# Patient Record
Sex: Female | Born: 1935 | Race: White | Hispanic: No | Marital: Single | Smoking: Never smoker
Health system: Southern US, Community
[De-identification: ages and names within clinical notes are randomized; demographics above are authoritative.]

## PROBLEM LIST (undated history)

## (undated) DIAGNOSIS — E039 Hypothyroidism, unspecified: Secondary | ICD-10-CM

## (undated) DIAGNOSIS — F0390 Unspecified dementia without behavioral disturbance: Secondary | ICD-10-CM

## (undated) DIAGNOSIS — I639 Cerebral infarction, unspecified: Secondary | ICD-10-CM

## (undated) DIAGNOSIS — N840 Polyp of corpus uteri: Secondary | ICD-10-CM

## (undated) DIAGNOSIS — H9190 Unspecified hearing loss, unspecified ear: Secondary | ICD-10-CM

## (undated) HISTORY — PX: TONSILLECTOMY: SUR1361

## (undated) HISTORY — PX: EYE SURGERY: SHX253

---

## 2016-10-18 ENCOUNTER — Other Ambulatory Visit: Payer: Self-pay | Admitting: Family Medicine

## 2016-10-18 DIAGNOSIS — N950 Postmenopausal bleeding: Secondary | ICD-10-CM

## 2016-10-21 ENCOUNTER — Inpatient Hospital Stay: Admission: RE | Admit: 2016-10-21 | Payer: Self-pay | Source: Ambulatory Visit

## 2016-11-15 ENCOUNTER — Ambulatory Visit
Admission: RE | Admit: 2016-11-15 | Discharge: 2016-11-15 | Disposition: A | Discharge status: Home / Self Care | Payer: Medicare Other | Source: Ambulatory Visit | Attending: Family Medicine | Admitting: Family Medicine

## 2016-11-15 DIAGNOSIS — N950 Postmenopausal bleeding: Secondary | ICD-10-CM

## 2017-01-04 NOTE — H&P (Signed)
NAME:  Penny Williams, Penny Williams              ACCOUNT NO.:  [id number]  MEDICAL RECORD NO.:  [id number]  LOCATION:                                 FACILITY:  PHYSICIAN:  Juluis MireJohn S. Kalii Chesmore, M.D.        DATE OF BIRTH:  DATE OF ADMISSION: DATE OF DISCHARGE:                             HISTORY & PHYSICAL   DATE OF HER SURGERY:  January 17, 2017, at the outpatient area at [facility].  HISTORY OF PRESENT ILLNESS:  The patient is an 83 year old postmenopausal patient, who was initially referred to us for evaluation of postmenopausal bleeding.  Subsequent saline infusion and ultrasound revealed an intracavitary mass measuring approximately 2 cm.  Some cystic areas were noted.  She now presents for hysteroscopy with MyoSure resection.  ALLERGIES:  In terms of allergies, there is no known drug allergies listed.  MEDICATIONS:  She is on levothyroxine, aspirin, chlorhexidine gluconate, vitamin D, and other supplementations.  PAST MEDICAL HISTORY:  She has a history of hypothyroidism, otherwise, usual childhood diseases.  PAST SURGICAL HISTORY:  She has had 3 vaginal deliveries and 1 D and C.  SOCIAL HISTORY:  Has no tobacco or alcohol use.  FAMILY HISTORY:  Noncontributory.  PHYSICAL EXAMINATION:  VITAL SIGNS:  The patient is afebrile with stable vital signs. HEENT:  The patient is normocephalic.  Pupils are equal, round, reactive to light and accommodation.  Extraocular movements were intact.  Sclerae and conjunctivae are clear.  Oropharynx clear. NECK:  Without thyromegaly. BREASTS:  No discrete masses. LUNGS:  Clear. CARDIOVASCULAR SYSTEM:  Regular rhythm and rate without murmurs or gallops.  There is no carotid or abdominal bruits. ABDOMEN:  Exam is benign.  No mass, organomegaly or tenderness. PELVIC:  Normal external genitalia.  Vaginal mucosa is clear.  Cervix unremarkable.  Uterus normal size, shape, and contour.  Adnexa, free of mass or tenderness. EXTREMITIES:  Trace  edema. NEUROLOGIC:  Grossly within normal limits.  IMPRESSION:  Postmenopausal bleeding with evidence of endometrial polyp.  PLAN:  The patient will undergo hysteroscopy.  Will have the MyoSure for resection.  The risks of surgery were discussed including the risk of infection.  The risk of vascular injury that could lead to hemorrhage requiring transfusion with the risk of AIDS or hepatitis.  Excessive bleeding could require hysterectomy.  Risk of perforation with injury to adjacent organs including bladder or bowel that could require further exploratory surgery.  Risk of deep venous thrombosis and pulmonary embolus.  The patient expressed understanding of the indications and risks.     Juluis MireJohn S. Deyonte Cadden, M.D.     JSM/MEDQ  D:  01/04/2017  T:  01/04/2017  Job:  [id number]

## 2017-01-04 NOTE — H&P (Signed)
Patient name Penny Williams, Penny Williams DICTATION# [id number] CSN# [id number]  [name],[name], MD 01/04/2017 1:37 PM

## 2017-01-11 ENCOUNTER — Encounter (HOSPITAL_BASED_OUTPATIENT_CLINIC_OR_DEPARTMENT_OTHER): Payer: Self-pay | Admitting: *Deleted

## 2017-01-11 NOTE — Progress Notes (Signed)
Spoke w pt's daughter, Sabino DonovanLinda Cauley. Pt has early dementia.  Instructions given for pt to be npo pmn 3/5.  May use flonase.  No snuff p mn.  [facility]  ekg requested from Dr. Kirtland BouchardK. Richter"s office.  Needs hgb on arrival. Daughter voiced concern d/t mom's dementia and  requested to speak w anesthesiologist re type of anesthesia planned .  Dr. Okey Dupreose informed.

## 2017-01-17 ENCOUNTER — Encounter (HOSPITAL_BASED_OUTPATIENT_CLINIC_OR_DEPARTMENT_OTHER)
Admission: RE | Disposition: A | Discharge status: Home / Self Care | Payer: Self-pay | Source: Ambulatory Visit | Attending: Obstetrics and Gynecology

## 2017-01-17 ENCOUNTER — Ambulatory Visit (HOSPITAL_BASED_OUTPATIENT_CLINIC_OR_DEPARTMENT_OTHER)
Admission: RE | Admit: 2017-01-17 | Discharge: 2017-01-17 | Disposition: A | Discharge status: Home / Self Care | Payer: Medicare Other | Source: Ambulatory Visit | Attending: Obstetrics and Gynecology | Admitting: Obstetrics and Gynecology

## 2017-01-17 ENCOUNTER — Ambulatory Visit (HOSPITAL_BASED_OUTPATIENT_CLINIC_OR_DEPARTMENT_OTHER): Payer: Medicare Other | Admitting: Anesthesiology

## 2017-01-17 ENCOUNTER — Other Ambulatory Visit: Payer: Self-pay

## 2017-01-17 ENCOUNTER — Encounter (HOSPITAL_BASED_OUTPATIENT_CLINIC_OR_DEPARTMENT_OTHER): Payer: Self-pay | Admitting: Anesthesiology

## 2017-01-17 DIAGNOSIS — Z79899 Other long term (current) drug therapy: Secondary | ICD-10-CM | POA: Insufficient documentation

## 2017-01-17 DIAGNOSIS — N950 Postmenopausal bleeding: Secondary | ICD-10-CM | POA: Insufficient documentation

## 2017-01-17 DIAGNOSIS — E039 Hypothyroidism, unspecified: Secondary | ICD-10-CM | POA: Diagnosis not present

## 2017-01-17 DIAGNOSIS — N840 Polyp of corpus uteri: Secondary | ICD-10-CM | POA: Diagnosis not present

## 2017-01-17 DIAGNOSIS — I4891 Unspecified atrial fibrillation: Secondary | ICD-10-CM | POA: Diagnosis not present

## 2017-01-17 DIAGNOSIS — F0390 Unspecified dementia without behavioral disturbance: Secondary | ICD-10-CM | POA: Insufficient documentation

## 2017-01-17 DIAGNOSIS — Z7982 Long term (current) use of aspirin: Secondary | ICD-10-CM | POA: Insufficient documentation

## 2017-01-17 DIAGNOSIS — Z5309 Procedure and treatment not carried out because of other contraindication: Secondary | ICD-10-CM | POA: Diagnosis not present

## 2017-01-17 DIAGNOSIS — Z8673 Personal history of transient ischemic attack (TIA), and cerebral infarction without residual deficits: Secondary | ICD-10-CM | POA: Diagnosis not present

## 2017-01-17 HISTORY — DX: Hypothyroidism, unspecified: E03.9

## 2017-01-17 HISTORY — DX: Cerebral infarction, unspecified: I63.9

## 2017-01-17 HISTORY — DX: Polyp of corpus uteri: N84.0

## 2017-01-17 HISTORY — DX: Unspecified dementia, unspecified severity, without behavioral disturbance, psychotic disturbance, mood disturbance, and anxiety: F03.90

## 2017-01-17 HISTORY — DX: Unspecified hearing loss, unspecified ear: H91.90

## 2017-01-17 LAB — BASIC METABOLIC PANEL
ANION GAP: 8 (ref 5–15)
BUN: 16 mg/dL (ref 6–20)
CHLORIDE: 107 mmol/L (ref 101–111)
CO2: 29 mmol/L (ref 22–32)
Calcium: 9.3 mg/dL (ref 8.9–10.3)
Creatinine, Ser: 0.82 mg/dL (ref 0.44–1.00)
GFR calc Af Amer: >60 mL/min (ref >60)
Glucose, Bld: 103 mg/dL — ABNORMAL HIGH (ref 65–99)
POTASSIUM: 3.4 mmol/L — AB (ref 3.5–5.1)
SODIUM: 144 mmol/L (ref 135–145)

## 2017-01-17 LAB — CBC
HEMATOCRIT: 39 % (ref 36.0–46.0)
Hemoglobin: 12.6 g/dL (ref 12.0–15.0)
MCH: 30.3 pg (ref 26.0–34.0)
MCHC: 32.3 g/dL (ref 30.0–36.0)
MCV: 93.8 fL (ref 78.0–100.0)
PLATELETS: 227 10*3/uL (ref 150–400)
RBC: 4.16 MIL/uL (ref 3.87–5.11)
RDW: 14.2 % (ref 11.5–15.5)
WBC: 6.8 10*3/uL (ref 4.0–10.5)

## 2017-01-17 LAB — TYPE AND SCREEN
ABO/RH(D): A POS
Antibody Screen: NEGATIVE

## 2017-01-17 LAB — ABO/RH: ABO/RH(D): A POS

## 2017-01-17 SURGERY — DILATATION & CURETTAGE/HYSTEROSCOPY WITH MYOSURE
Anesthesia: General

## 2017-01-17 MED ORDER — FENTANYL CITRATE (PF) 100 MCG/2ML IJ SOLN
INTRAMUSCULAR | Status: AC
  Filled 2017-01-17: qty 2

## 2017-01-17 MED ORDER — CEFAZOLIN SODIUM-DEXTROSE 2-4 GM/100ML-% IV SOLN
2.0000 g | INTRAVENOUS | Status: DC
  Filled 2017-01-17: qty 100

## 2017-01-17 MED ORDER — PROPOFOL 10 MG/ML IV BOLUS
INTRAVENOUS | Status: AC
  Filled 2017-01-17: qty 20

## 2017-01-17 MED ORDER — LACTATED RINGERS IV SOLN
INTRAVENOUS | Status: DC
  Administered 2017-01-17: 07:00:00 via INTRAVENOUS
  Filled 2017-01-17: qty 1000

## 2017-01-17 MED ORDER — LIDOCAINE 2% (20 MG/ML) 5 ML SYRINGE
INTRAMUSCULAR | Status: AC
  Filled 2017-01-17: qty 5

## 2017-01-17 MED ORDER — DEXAMETHASONE SODIUM PHOSPHATE 10 MG/ML IJ SOLN
INTRAMUSCULAR | Status: AC
  Filled 2017-01-17: qty 1

## 2017-01-17 MED ORDER — SODIUM CHLORIDE 0.9 % IR SOLN
Status: DC | PRN
  Administered 2017-01-17: 3000 mL

## 2017-01-17 MED ORDER — ONDANSETRON HCL 4 MG/2ML IJ SOLN
INTRAMUSCULAR | Status: AC
  Filled 2017-01-17: qty 2

## 2017-01-17 MED ORDER — CEFAZOLIN SODIUM-DEXTROSE 2-4 GM/100ML-% IV SOLN
INTRAVENOUS | Status: AC
  Filled 2017-01-17: qty 100

## 2017-01-17 MED ORDER — CIPROFLOXACIN IN D5W 400 MG/200ML IV SOLN
INTRAVENOUS | Status: AC
Start: 2017-01-17 — End: 2017-01-17
  Filled 2017-01-17: qty 200

## 2017-01-17 SURGICAL SUPPLY — 31 items
CANISTER SUCT 3000ML PPV (MISCELLANEOUS) IMPLANT
CATH ROBINSON RED A/P 16FR (CATHETERS) IMPLANT
COVER BACK TABLE 60X90IN (DRAPES) IMPLANT
DEVICE MYOSURE LITE (MISCELLANEOUS) IMPLANT
DEVICE MYOSURE REACH (MISCELLANEOUS) IMPLANT
DILATOR CANAL MILEX (MISCELLANEOUS) IMPLANT
DRAPE LG THREE QUARTER DISP (DRAPES) IMPLANT
DRSG TELFA 3X8 NADH (GAUZE/BANDAGES/DRESSINGS) IMPLANT
ELECT REM PT RETURN 9FT ADLT (ELECTROSURGICAL)
ELECTRODE REM PT RTRN 9FT ADLT (ELECTROSURGICAL) IMPLANT
FILTER ARTHROSCOPY CONVERTOR (FILTER) IMPLANT
GLOVE BIO SURGEON STRL SZ7 (GLOVE) IMPLANT
GOWN STRL REUS W/ TWL LRG LVL3 (GOWN DISPOSABLE) IMPLANT
GOWN STRL REUS W/TWL LRG LVL3 (GOWN DISPOSABLE)
IV NS IRRIG 3000ML ARTHROMATIC (IV SOLUTION) IMPLANT
KIT RM TURNOVER CYSTO AR (KITS) IMPLANT
LEGGING LITHOTOMY PAIR STRL (DRAPES) IMPLANT
MYOSURE XL FIBROID REM (MISCELLANEOUS)
NEEDLE SPNL 18GX3.5 QUINCKE PK (NEEDLE) IMPLANT
PACK BASIN DAY SURGERY FS (CUSTOM PROCEDURE TRAY) IMPLANT
PAD OB MATERNITY 4.3X12.25 (PERSONAL CARE ITEMS) IMPLANT
PAD PREP 24X48 CUFFED NSTRL (MISCELLANEOUS) IMPLANT
SEAL ROD LENS SCOPE MYOSURE (ABLATOR) IMPLANT
SYR CONTROL 10ML LL (SYRINGE) IMPLANT
SYSTEM TISS REMOVAL MYSR XL RM (MISCELLANEOUS) IMPLANT
TOWEL OR 17X24 6PK STRL BLUE (TOWEL DISPOSABLE) IMPLANT
TUBE CONNECTING 12'X1/4 (SUCTIONS)
TUBE CONNECTING 12X1/4 (SUCTIONS) IMPLANT
TUBING AQUILEX INFLOW (TUBING) IMPLANT
TUBING AQUILEX OUTFLOW (TUBING) IMPLANT
WATER STERILE IRR 500ML POUR (IV SOLUTION) IMPLANT

## 2017-01-17 NOTE — H&P (Signed)
  History and physical exam unchanged 

## 2017-01-17 NOTE — Progress Notes (Addendum)
Patient noted to be in new onset atrial fibrillation with HR varying between 80s-110s when connected to EKG monitor in OR prior to induction of anesthesia. BP 194/79.  Patient denies CP, SOB, any history of atrial fibrillation.  Review of old EKG showed normal sinus rhythm.  Discussed with Dr. Arelia SneddonMcComb and agreed to cancel the case in light of new onset atrial fibrillation.  EKG in PACU showed sinus tachycardia.  Appointment made for patient to see Cardiology as an outpatient on 01/19/17.  Discussed with patient and patient's family in PACU.  All questions answered.  Rondall AllegraS. Kynadi Dragos, MD

## 2017-01-17 NOTE — H&P (Signed)
Patient in atrial fib,  Case cancelled. Sent to emergency dept for evaluation.

## 2017-01-17 NOTE — Anesthesia Preprocedure Evaluation (Addendum)
Anesthesia Evaluation  Patient identified by MRN, date of birth, ID band Patient awake    Reviewed: Allergy & Precautions, NPO status , Patient's Chart, lab work & pertinent test results  Airway Mallampati: II  TM Distance: >3 FB Neck ROM: Full    Dental  (+) Teeth Intact, Dental Advisory Given, Chipped,    Pulmonary neg pulmonary ROS,    Pulmonary exam normal breath sounds clear to auscultation       Cardiovascular negative cardio ROS Normal cardiovascular exam Rhythm:Regular Rate:Normal     Neuro/Psych Dementia  TIA   GI/Hepatic negative GI ROS, Neg liver ROS,   Endo/Other  Hypothyroidism   Renal/GU negative Renal ROS     Musculoskeletal negative musculoskeletal ROS (+)   Abdominal   Peds  Hematology negative hematology ROS (+)   Anesthesia Other Findings Day of surgery medications reviewed with the patient.  Reproductive/Obstetrics Uterine polyp                            Anesthesia Physical Anesthesia Plan  ASA: III  Anesthesia Plan: General   Post-op Pain Management:    Induction: Intravenous  Airway Management Planned: LMA  Additional Equipment:   Intra-op Plan:   Post-operative Plan: Extubation in OR  Informed Consent: I have reviewed the patients History and Physical, chart, labs and discussed the procedure including the risks, benefits and alternatives for the proposed anesthesia with the patient or authorized representative who has indicated his/her understanding and acceptance.   Dental advisory given  Plan Discussed with: CRNA  Anesthesia Plan Comments: (Risks/benefits of general anesthesia discussed with patient including risk of damage to teeth, lips, gum, and tongue, nausea/vomiting, allergic reactions to medications, and the possibility of heart attack, stroke and death.  All patient questions answered.  Patient wishes to proceed.  CASE CANCELLED)        Anesthesia Quick Evaluation

## 2017-01-17 NOTE — Transfer of Care (Signed)
Immediate Anesthesia Transfer of Care Note  Patient: Penny MorrisonHelen M Rosenstock  Procedure(s) Performed: Procedure(s): DILATATION & CURETTAGE/HYSTEROSCOPY WITH MYOSURE (N/A)  Patient Location: PACU  Anesthesia Type:surgery cancelled  Level of Consciousness: awake and oriented  Airway & Oxygen Therapy: Patient Spontanous Breathing  Post-op Assessment: Report given to RN  Post vital signs: Reviewed and stable  Last Vitals: 179/75, 117, 17,  Vitals:   01/17/17 0615  BP: (!) 165/59  Pulse: 88  Resp: 14  Temp: 36.9 C    Last Pain:  Vitals:   01/17/17 0615  TempSrc: Oral      Patients Stated Pain Goal: 7 (01/17/17 [id number])  Complications: new onset afib, intermittent

## 2017-01-19 ENCOUNTER — Ambulatory Visit: Payer: Medicare Other | Admitting: Cardiovascular Disease

## 2017-01-25 DIAGNOSIS — I639 Cerebral infarction, unspecified: Secondary | ICD-10-CM | POA: Insufficient documentation

## 2017-01-25 DIAGNOSIS — E039 Hypothyroidism, unspecified: Secondary | ICD-10-CM | POA: Insufficient documentation

## 2017-01-30 NOTE — Progress Notes (Signed)
Cardiology Office Note   Date:  02/02/2017   ID:  Penny Williams, Penny Williams 06/11/1927, MRN [id number]  PCP:  Dois Davenport., MD  Cardiologist:   Charlton Haws, MD   No chief complaint on file.     History of Present Illness: Penny Williams is a 83 y.o. female who presents for evaluation of newly discovered Afib.  01/17/17 was to have Hysteroscopy with Myosure Dr Arelia Sneddon. Had some postmenopausal bleeding with 2 cm intracavitary mass  Measuring 2 cm  When hooked to monitor was in afib rates 80-110  Anesthesia Dr Desmond Lope cancelled case.  This patients CHA2DS2-VASc Score and unadjusted Ischemic Stroke Rate (% per year) is equal to 2.2 % stroke rate/year from a score of 2  Above score calculated as 1 point each if present [CHF, HTN, DM, Vascular=MI/PAD/Aortic Plaque, Age if 65-74, or Female] Above score calculated as 2 points each if present [Age > 75, or Stroke/TIA/TE]  Hct ok at 39 K 3.4  Cr .82  01/17/17 no TSH done   I reviewed both her ECG;s and she is not in afib. She has long RP tachycardia. Also reviewed with Dr Johney Frame Who agrees this is not atrial fibrillation In office today she is in NSR and I suspect she has periods of long RP tachycardia that are asymptomatic.   He primary complained is making too much saliva and being hard of hearing Daughter is with her today And she has two sons locally that help her out   Past Medical History:  Diagnosis Date  . Dementia    early stages  . HOH (hard of hearing)    wears hearing aids  . Hypothyroidism   . Stroke (HCC)    TIA's x2  . Uterine polyp     Past Surgical History:  Procedure Laterality Date  . EYE SURGERY Bilateral    cataract extraction  . TONSILLECTOMY       Current Outpatient Prescriptions  Medication Sig Dispense Refill  . aspirin EC 81 MG tablet Take 81 mg by mouth daily.    . calcium citrate-vitamin D (CITRACAL+D) 315-200 MG-UNIT tablet Take 1 tablet by mouth daily.    . chlorhexidine (PERIDEX) 0.12 %  solution Use as directed 15 mLs in the mouth or throat 2 (two) times daily.    . cholecalciferol (VITAMIN D) 1000 units tablet Take 1,000 Units by mouth daily.    . fluticasone (FLONASE) 50 MCG/ACT nasal spray Place 1 spray into both nostrils daily.    Marland Kitchen levothyroxine (SYNTHROID, LEVOTHROID) 112 MCG tablet Take 112 mcg by mouth at bedtime.    . Memantine HCl (NAMENDA XR PO) Take 14 mg by mouth daily after lunch.    . metoprolol tartrate (LOPRESSOR) 25 MG tablet Take 1 tablet (25 mg total) by mouth 2 (two) times daily. 180 tablet 3  . polyethylene glycol (MIRALAX / GLYCOLAX) packet Take 17 g by mouth daily as needed.    . Prenatal Vit-Fe Fumarate-FA (M-VIT) tablet Take 1 tablet by mouth daily.     No current facility-administered medications for this visit.     Allergies:   Patient has no known allergies.    Social History:  The patient  reports that she has never smoked. Her smokeless tobacco use includes Snuff. She reports that she does not drink alcohol.   Family History:  The patient's family history is not on file.    ROS:  Please see the history of present illness.   Otherwise, review of  systems are positive for none.   All other systems are reviewed and negative.    PHYSICAL EXAM: VS:  BP 138/78   Pulse 88   Ht 5\' 3"  (1.6 m)   Wt 124 lb 8 oz (56.5 kg)   SpO2 96%   BMI 22.05 kg/m  , BMI Body mass index is 22.05 kg/m. Affect appropriate Healthy:  appears stated age HEENT: poor hearing  Neck supple with no adenopathy JVP normal no bruits no thyromegaly Lungs clear with no wheezing and good diaphragmatic motion Heart:  S1/S2 no murmur, no rub, gallop or click PMI normal Abdomen: benighn, BS positve, no tenderness, no AAA no bruit.  No HSM or HJR Distal pulses intact with no bruits No edema Neuro non-focal Skin warm and dry No muscular weakness    EKG:  3/6 read as ST  Has long RP tachycardia rate 111 2nd ECG rate 105 and can see break in tachycardia With sinus  beat  02/02/17  SR rate 88 nonspecific ST/T wave changes    Recent Labs: 01/17/2017: BUN 16; Creatinine, Ser 0.82; Hemoglobin 12.6; Platelets 227; Potassium 3.4; Sodium 144    Lipid Panel No results found for: CHOL, TRIG, HDL, CHOLHDL, VLDL, LDLCALC, LDLDIRECT    Wt Readings from Last 3 Encounters:  02/02/17 124 lb 8 oz (56.5 kg)  01/17/17 123 lb 8 oz (56 kg)      Other studies Reviewed: Additional studies/ records that were reviewed today include: Notes Dr Arelia SneddonMcComb, Dr Desmond Lopeurk and [facility]/17/17 x 2 .    ASSESSMENT AND PLAN:  1.  Arrhythmia not afib long RP tachycardia no need for anticoagulation especially with vaginal bleeding  Will start on lopressor 25 bid and check echo to make sure EF ok.  Clear to have surgery  2. Vaginal Bleeding f/u Mccombs and reschedule surgery not active now Hct ok  3. Thyroid on replacement with no recent lab given tachycardia will check TSH and free T4 today    Current medicines are reviewed at length with the patient today.  The patient does not have concerns regarding medicines.  The following changes have been made:  lopresser 25 bid   Labs/ tests ordered today include: TSH/T4  Echo   Orders Placed This Encounter  Procedures  . TSH  . T4, free  . EKG 12-Lead  . ECHOCARDIOGRAM COMPLETE     Disposition:   FU with me post op in a few weeks      Signed, Charlton HawsPeter Ida Milbrath, MD  02/02/2017 11:21 AM    [facility] [address], [place], [place]  [address] Phone: [phone]; Fax: [phone]

## 2017-02-02 ENCOUNTER — Ambulatory Visit (INDEPENDENT_AMBULATORY_CARE_PROVIDER_SITE_OTHER): Payer: Medicare Other | Admitting: Cardiovascular Disease

## 2017-02-02 DIAGNOSIS — R000 Tachycardia, unspecified: Secondary | ICD-10-CM | POA: Diagnosis not present

## 2017-02-02 LAB — T4, FREE: Free T4: 1.42 ng/dL (ref 0.82–1.77)

## 2017-02-02 LAB — TSH: TSH: 8.94 u[IU]/mL — AB (ref 0.450–4.500)

## 2017-02-02 MED ORDER — METOPROLOL TARTRATE 25 MG PO TABS: 25.0000 mg | ORAL_TABLET | Freq: Two times a day (BID) | ORAL | 3 refills | Status: DC

## 2017-02-02 NOTE — Patient Instructions (Signed)
Medication Instructions:  1. START METOPROLOL TARTRATE 25 MG TABLET ; TAKE 1 TABLET TWICE DAILY  Labwork: 1. TODAY TSH, FREE T4  Testing/Procedures: Your physician has requested that you have an echocardiogram. Echocardiography is a painless test that uses sound waves to create images of your heart. It provides your doctor with information about the size and shape of your heart and how well your heart's chambers and valves are working. This procedure takes approximately one hour. There are no restrictions for this procedure.    Follow-Up: DR. Eden EmmsNISHAN NEXT AVAILABLE PER DR. Eden EmmsNISHAN  Any Other Special Instructions Will Be Listed Below (If Applicable).     If you need a refill on your cardiac medications before your next appointment, please call your pharmacy.

## 2017-02-06 ENCOUNTER — Ambulatory Visit: Payer: Medicare Other | Admitting: Cardiovascular Disease

## 2017-02-14 ENCOUNTER — Encounter: Payer: Self-pay | Admitting: Cardiovascular Disease

## 2017-02-15 NOTE — Consult Note (Signed)
Patient name Penny Williams, Dewing DICTATION# [id number] CSN# [id number]  Juluis Mire, MD 02/15/2017 3:23 PM

## 2017-02-15 NOTE — H&P (Unsigned)
NAMEMarland Williams  [name], [name] NO.:  [id number]  MEDICAL RECORD NO.:  [id number]  LOCATION:                                 FACILITY:  PHYSICIAN:  Juluis Mire, M.D.        DATE OF BIRTH:  DATE OF ADMISSION: DATE OF DISCHARGE:                             HISTORY & PHYSICAL   DATE OF SURGERY:  March 07, 2017, at [facility] outpatient on [address].  HISTORY OF PRESENT ILLNESS:  The patient is an 83 year old postmenopausal patient who comes in for hysteroscopy with MyoSure resection of apparent endometrial polyp.  The patient started experiencing abnormal uterine bleeding.  Subsequently, she underwent a saline infusion ultrasound here in the office that revealed an endometrial polypoid mass.  She now presents for hysteroscopy with MyoSure resection of the endometrial process.  ALLERGIES:  In terms of allergies, she has no known drug allergies listed.  MEDICATIONS:  She is on levothyroxine, chlorhexidine, gluconate, vitamin D, and other replacements.  PAST MEDICAL HISTORY:  During her preop prior to this earlier, she was found to have an irregular heart beat.  She was subsequently evaluated for atrial fib.  She was evaluated by Dr. Eden Emms, the cardiologist, in town.  They did an echocardiogram and complete EKG.  They cleared her for surgery at this point in time.  PAST SURGICAL HISTORY:  She has had eye surgery, cataract extraction, and tonsillectomy.  SOCIAL HISTORY:  No tobacco or alcohol use.  FAMILY HISTORY:  Noncontributory.  PHYSICAL EXAMINATION:  VITAL SIGNS:  The patient is afebrile.  Stable vital signs. HEENT:  The patient is normocephalic.  Pupils equal, round, and reactive to light and accommodation.  Extraocular movements are intact.  Sclerae and conjunctivae clear.  Oropharynx clear. NECK:  Without thyromegaly. BREASTS:  No discrete masses. LUNGS:  Clear. CARDIOVASCULAR SYSTEM:  Regular rate.  No murmurs or gallops. ABDOMEN:  Benign.   No mass, organomegaly, or tenderness. PELVIC:  Normal external genitalia.  Vaginal mucosa is clear.  Cervix unremarkable.  Uterus normal size, shape, and contour.  Adnexa free of masses or tenderness. EXTREMITIES:  Trace edema. NEUROLOGIC:  Grossly within normal limits.  IMPRESSION: 1. Postmenopausal bleeding with endometrial polyp. 2. Episodes of atrial fib, now under management.  PLAN:  The patient will undergo the above-noted surgery.  The risks of surgery have been discussed including the risk of infection.  The risk of hemorrhage that could require transfusion with the risk of AIDS or hepatitis.  Excessive bleeding could require hysterectomy.  There is a risk of perforation with injury to adjacent organs that could require further exploratory surgery.  Risk of deep venous thrombosis and pulmonary embolus.  The patient expressed understanding of the indications and risks.     Juluis Mire, M.D.     JSM/MEDQ  D:  02/15/2017  T:  02/15/2017  Job:  [id number]

## 2017-02-17 ENCOUNTER — Ambulatory Visit (HOSPITAL_COMMUNITY): Payer: Medicare Other | Attending: Internal Medicine

## 2017-02-17 ENCOUNTER — Other Ambulatory Visit: Payer: Self-pay

## 2017-02-17 DIAGNOSIS — I350 Nonrheumatic aortic (valve) stenosis: Secondary | ICD-10-CM | POA: Insufficient documentation

## 2017-02-17 DIAGNOSIS — R000 Tachycardia, unspecified: Secondary | ICD-10-CM | POA: Insufficient documentation

## 2017-02-24 NOTE — Progress Notes (Signed)
Cardiology Office Note   Date:  02/28/2017   ID:  CECILEY Williams, DOB [birth year], MRN [id number]  PCP:  Dois Davenport., MD  Cardiologist:   Charlton Haws, MD   Chief Complaint  Patient presents with  . Tachycardia      History of Present Illness: Penny Williams is a 83 y.o. female first seen for  evaluation of newly discovered Afib 79!8  .  01/17/17 was to have Hysteroscopy with Myosure Dr Arelia Sneddon. Had some postmenopausal bleeding with 2 cm intracavitary mass  Measuring 2 cm  When hooked to monitor was in afib rates 80-110  Anesthesia Dr Desmond Lope cancelled case.  This patients CHA2DS2-VASc Score and unadjusted Ischemic Stroke Rate (% per year) is equal to 2.2 % stroke rate/year from a score of 2  Above score calculated as 1 point each if present [CHF, HTN, DM, Vascular=MI/PAD/Aortic Plaque, Age if 65-74, or Female] Above score calculated as 2 points each if present [Age > 75, or Stroke/TIA/TE]  Hct ok at 39 K 3.4  Cr .82  01/17/17 no TSH done   I reviewed both her ECG;s and she is not in afib. She has long RP tachycardia. Also reviewed with Dr Johney Frame Who agrees this is not atrial fibrillation In office today she is in NSR and I suspect she has periods of long RP tachycardia that are asymptomatic.   He primary complained is making too much saliva and being hard of hearing Penny Williams is with her today And she has two sons locally that help her out  Surgery has been rescheduled for 03/07/17   Started on lopressor 25 bid Reviewed echo done 02/17/17 EF 60-65% Grade 3 diastolic Mild MR Estimated PA 35 mmHg  She continues to use snuff since age 29 She has decided not to have surgery No palpitations and tolerating lopressor  Past Medical History:  Diagnosis Date  . Dementia    early stages  . HOH (hard of hearing)    wears hearing aids  . Hypothyroidism   . Stroke (HCC)    TIA's x2  . Uterine polyp     Past Surgical History:  Procedure Laterality Date  . EYE SURGERY  Bilateral    cataract extraction  . TONSILLECTOMY       Current Outpatient Prescriptions  Medication Sig Dispense Refill  . aspirin EC 81 MG tablet Take 81 mg by mouth daily.    . calcium citrate-vitamin D (CITRACAL+D) 315-200 MG-UNIT tablet Take 1 tablet by mouth daily.    . chlorhexidine (PERIDEX) 0.12 % solution Use as directed 15 mLs in the mouth or throat 2 (two) times daily.    . cholecalciferol (VITAMIN D) 1000 units tablet Take 1,000 Units by mouth daily.    . fluticasone (FLONASE) 50 MCG/ACT nasal spray Place 2 sprays into both nostrils daily.    Marland Kitchen levothyroxine (SYNTHROID, LEVOTHROID) 112 MCG tablet Take 112 mcg by mouth at bedtime.    . Memantine HCl (NAMENDA XR PO) Take 14 mg by mouth daily after lunch.    . metoprolol tartrate (LOPRESSOR) 25 MG tablet Take 1 tablet (25 mg total) by mouth 2 (two) times daily. 180 tablet 3  . polyethylene glycol (MIRALAX / GLYCOLAX) packet Take 17 g by mouth daily as needed.    . Prenatal Vit-Fe Fumarate-FA (M-VIT) tablet Take 1 tablet by mouth daily.     No current facility-administered medications for this visit.     Allergies:   Patient has no known allergies.  Social History:  The patient  reports that she has never smoked. Her smokeless tobacco use includes Snuff. She reports that she does not drink alcohol.   Family History:  The patient's family history is not on file.    ROS:  Please see the history of present illness.   Otherwise, review of systems are positive for none.   All other systems are reviewed and negative.    PHYSICAL EXAM: VS:  BP (!) 160/68   Pulse 70   Ht  (1.6 m)   Wt 128 lb 1.9 oz (58.1 kg)   SpO2 96%   BMI 22.70 kg/m  , BMI Body mass index is 22.7 kg/m. Affect appropriate Healthy:  appears stated age HEENT: poor hearing  Neck supple with no adenopathy JVP normal no bruits no thyromegaly Lungs clear with no wheezing and good diaphragmatic motion Heart:  S1/S2 no murmur, no rub, gallop or  click PMI normal Abdomen: benighn, BS positve, no tenderness, no AAA no bruit.  No HSM or HJR Distal pulses intact with no bruits No edema Neuro non-focal Skin warm and dry No muscular weakness    EKG:  3/6 read as ST  Has long RP tachycardia rate 111 2nd ECG rate 105 and can see break in tachycardia With sinus beat  02/02/17  SR rate 88 nonspecific ST/T wave changes    Recent Labs: 01/17/2017: BUN 16; Creatinine, Ser 0.82; Hemoglobin 12.6; Platelets 227; Potassium 3.4; Sodium 144 02/02/2017: TSH 8.940    Lipid Panel No results found for: CHOL, TRIG, HDL, CHOLHDL, VLDL, LDLCALC, LDLDIRECT    Wt Readings from Last 3 Encounters:  02/28/17 128 lb 1.9 oz (58.1 kg)  02/02/17 124 lb 8 oz (56.5 kg)  01/17/17 123 lb 8 oz (56 kg)      Other studies Reviewed: Additional studies/ records that were reviewed today include: Notes Dr Arelia Sneddon, Dr Desmond Lope and ECG;s from [facility] 01/17/17 x 2 .    ASSESSMENT AND PLAN:  1.  Arrhythmia not afib long RP tachycardia no need for anticoagulation  Continue beta blocker improved pulse NSR in 70's today  2. Vaginal Bleeding f/u Mccombs currently no plans for surgery  Lab Results  Component Value Date   TSH 8.940 (H) 02/02/2017       Charlton Haws, MD

## 2017-02-28 ENCOUNTER — Encounter: Payer: Self-pay | Admitting: Cardiovascular Disease

## 2017-02-28 ENCOUNTER — Ambulatory Visit (INDEPENDENT_AMBULATORY_CARE_PROVIDER_SITE_OTHER): Payer: Medicare Other | Admitting: Cardiovascular Disease

## 2017-02-28 ENCOUNTER — Encounter (INDEPENDENT_AMBULATORY_CARE_PROVIDER_SITE_OTHER): Payer: Self-pay

## 2017-02-28 VITALS — BP 160/68 | HR 70 | Ht 63.0 in | Wt 128.1 lb

## 2017-02-28 DIAGNOSIS — R000 Tachycardia, unspecified: Secondary | ICD-10-CM | POA: Diagnosis not present

## 2017-02-28 NOTE — Patient Instructions (Addendum)

## 2017-03-07 ENCOUNTER — Telehealth: Payer: Self-pay | Admitting: Cardiovascular Disease

## 2017-03-07 ENCOUNTER — Ambulatory Visit: Admit: 2017-03-07 | Payer: Medicare Other | Admitting: Obstetrics and Gynecology

## 2017-03-07 SURGERY — DILATATION & CURETTAGE/HYSTEROSCOPY WITH MYOSURE
Anesthesia: Choice

## 2017-03-07 NOTE — Telephone Encounter (Signed)
Follow Up:   Returning Pam's call from yesterday,concerning her Echo results.

## 2017-03-07 NOTE — Telephone Encounter (Signed)
Spoke with daughter, DPR, and she states Dr. Eden Emms went over results with she and pt at pt's appt on 4/17.  Advised I would make Dr. Fabio Bering nurse aware.  Daughter appreciative for call.

## 2017-03-31 ENCOUNTER — Telehealth: Payer: Self-pay | Admitting: Cardiovascular Disease

## 2017-03-31 MED ORDER — METOPROLOL SUCCINATE ER 50 MG PO TB24
50.0000 mg | ORAL_TABLET | Freq: Every day | ORAL | 3 refills | Status: DC
Start: 2017-03-31 — End: 2018-06-14

## 2017-03-31 NOTE — Telephone Encounter (Signed)
Patient's daughter (DPR) wanted to see if her twice a day metoprolol 25 mg can be changed to a once daily. Patient has dementia and is refusing to take her second pill of the day. Patient's daughter feels that it will be easier to give medication once daily. Spoke with DOD, Dr. Delton SeeNelson, she agreed to change patient's medication to Metoprolol succinate 50 mg daily since Dr. Eden EmmsNishan is out of town. Informed patient's daughter of changes and see agreed to change.

## 2017-03-31 NOTE — Telephone Encounter (Signed)
New Message  Pt daughter call requesting to speak with RN about pts medication list from Dr. Eden EmmsNishan. She states she just have some questions about the meds Please call back to discuss

## 2017-09-18 ENCOUNTER — Other Ambulatory Visit: Payer: Self-pay | Admitting: Family Medicine

## 2017-09-18 ENCOUNTER — Ambulatory Visit
Admission: RE | Admit: 2017-09-18 | Discharge: 2017-09-18 | Disposition: A | Discharge status: Home / Self Care | Payer: Medicare Other | Source: Ambulatory Visit | Attending: Family Medicine | Admitting: Family Medicine

## 2017-09-18 DIAGNOSIS — M549 Dorsalgia, unspecified: Secondary | ICD-10-CM

## 2017-09-29 ENCOUNTER — Ambulatory Visit
Admission: RE | Admit: 2017-09-29 | Discharge: 2017-09-29 | Disposition: A | Discharge status: Home / Self Care | Payer: Medicare Other | Source: Ambulatory Visit | Attending: Family Medicine | Admitting: Family Medicine

## 2017-09-29 ENCOUNTER — Other Ambulatory Visit: Payer: Self-pay | Admitting: Family Medicine

## 2017-09-29 DIAGNOSIS — M25551 Pain in right hip: Secondary | ICD-10-CM

## 2018-03-12 IMAGING — CR DG LUMBAR SPINE COMPLETE 4+V
5 series · 5 of 5 positions shown · non-contrast
Comparison: None.

CLINICAL DATA: Back pain

EXAM:
LUMBAR SPINE - COMPLETE 4+ VIEW

[t l-spine a.p.]
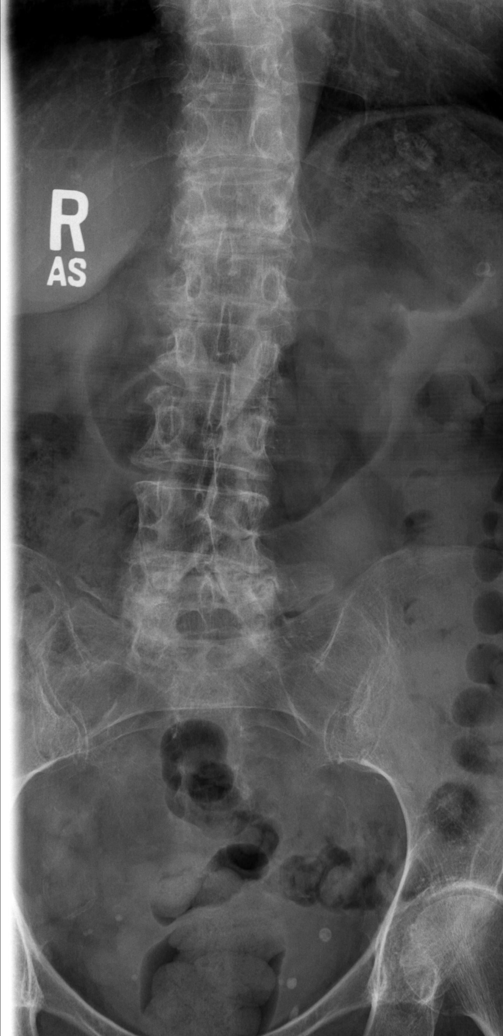

[t l-spine oblique exposure (1 of 2)]
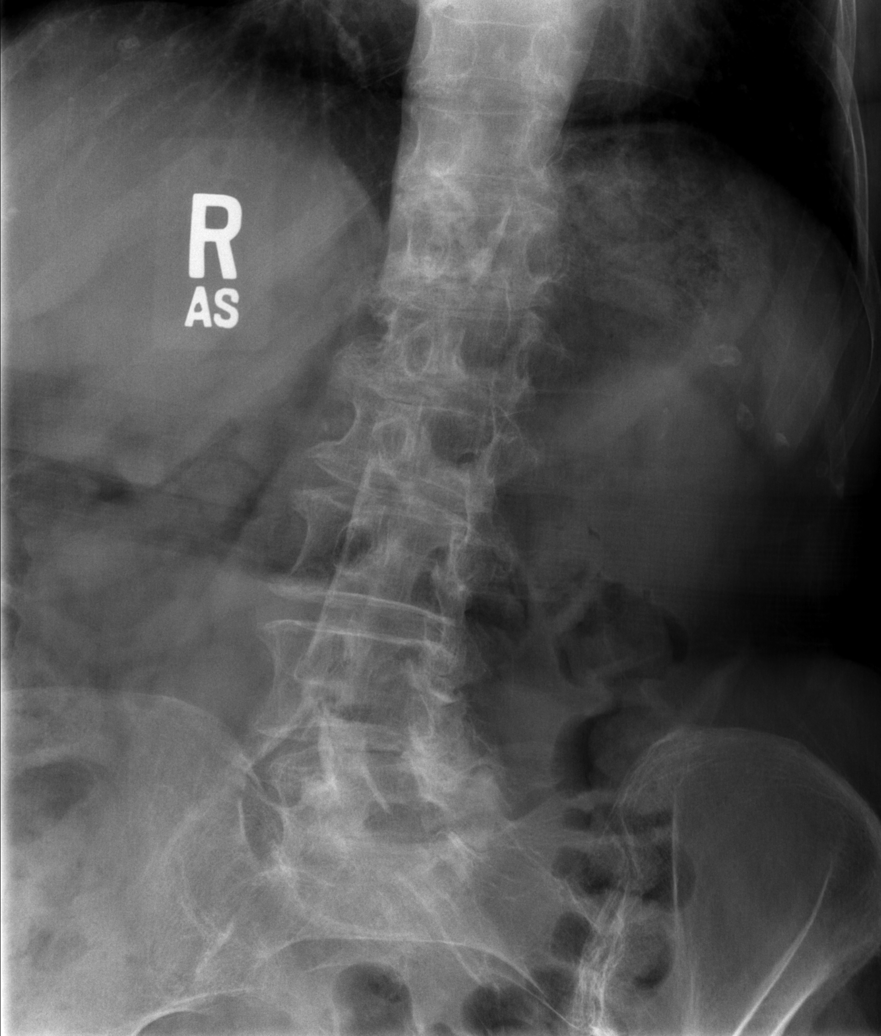

[t l-spine oblique exposure (2 of 2)]
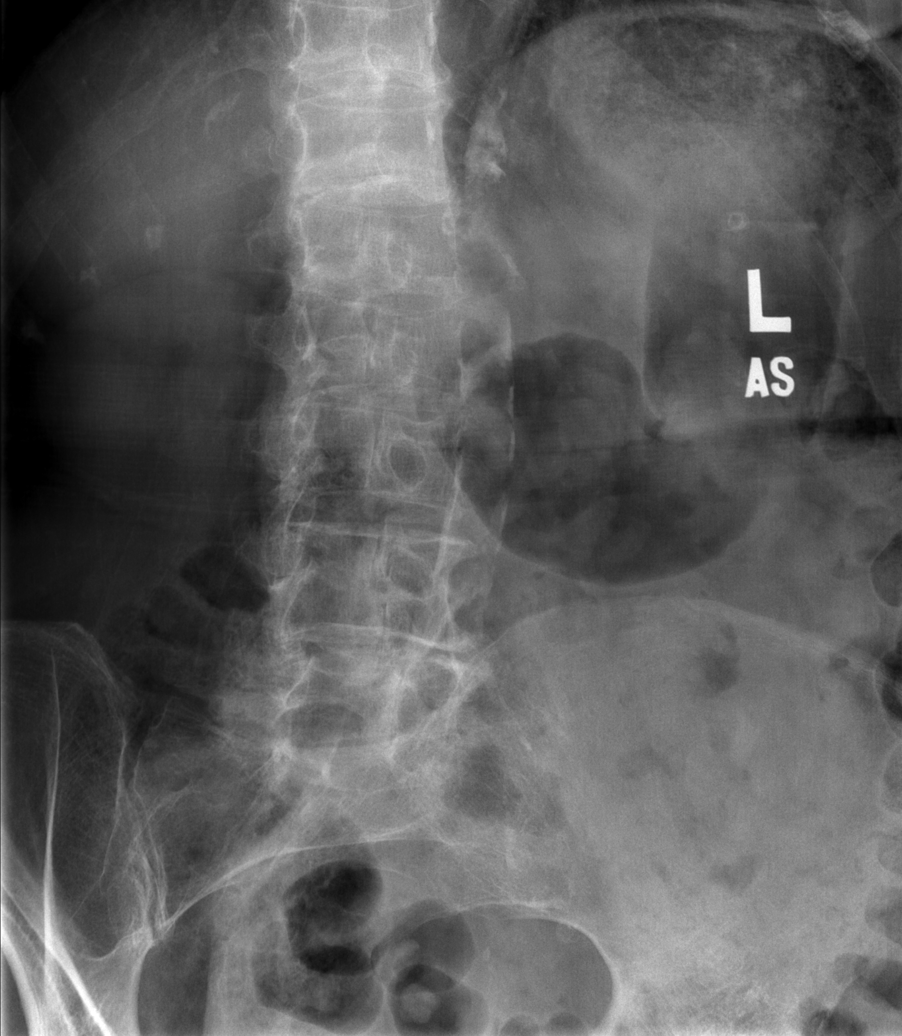

[t l-spine lat]
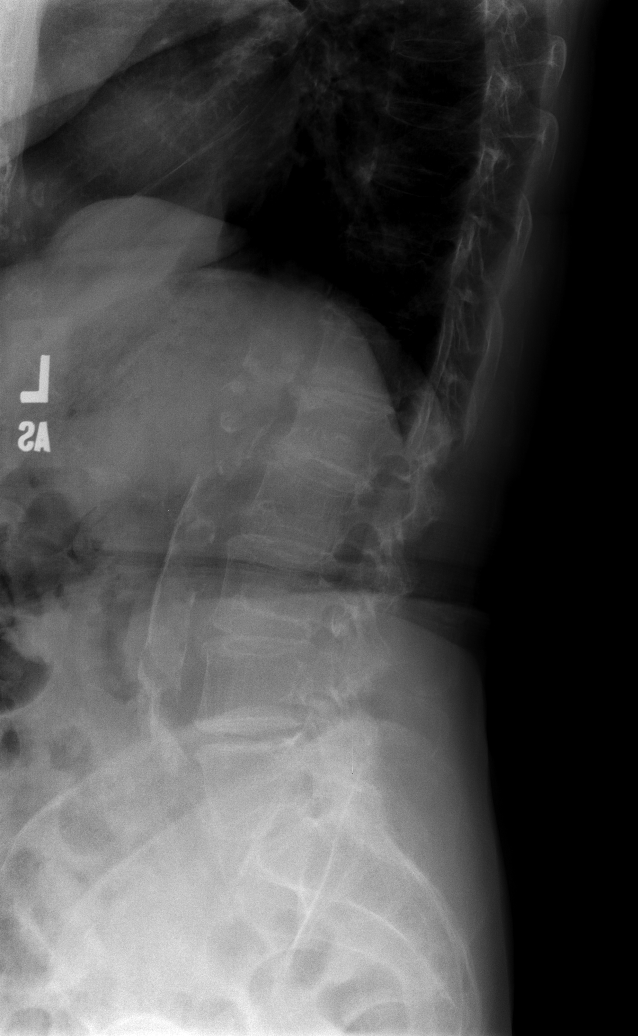

[t l-spine l5-s1 spot *]
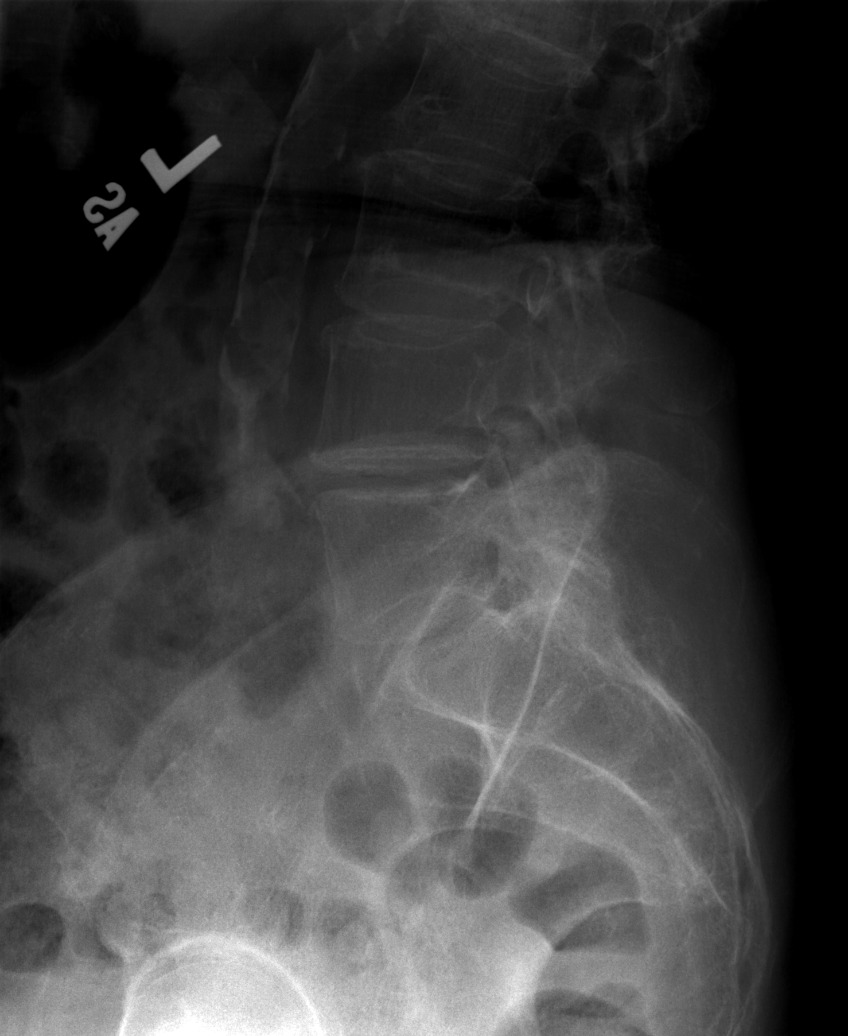

[5 of 5 positions shown; findings below may reference images not displayed]

FINDINGS: Mild scoliosis in the lumbar spine. Normal sagittal alignment.
Negative for fracture or mass.

Extensive disc degeneration and spurring T12-L1. Moderate disc
degeneration L4-5. Negative for pars defect

Extensive atherosclerotic calcification of the aorta without
aneurysm.
IMPRESSION: Scoliosis and lumbar degenerative change. No acute skeletal
abnormality.

## 2018-03-23 IMAGING — CR DG HIP (WITH OR WITHOUT PELVIS) 2-3V*R*
2 series · 2 of 2 positions shown · non-contrast
Comparison: None.

CLINICAL DATA: Chronic right hip pain.  No known injury.

EXAM:
DG HIP (WITH OR WITHOUT PELVIS) 2-3V RIGHT

[t hip ap right]
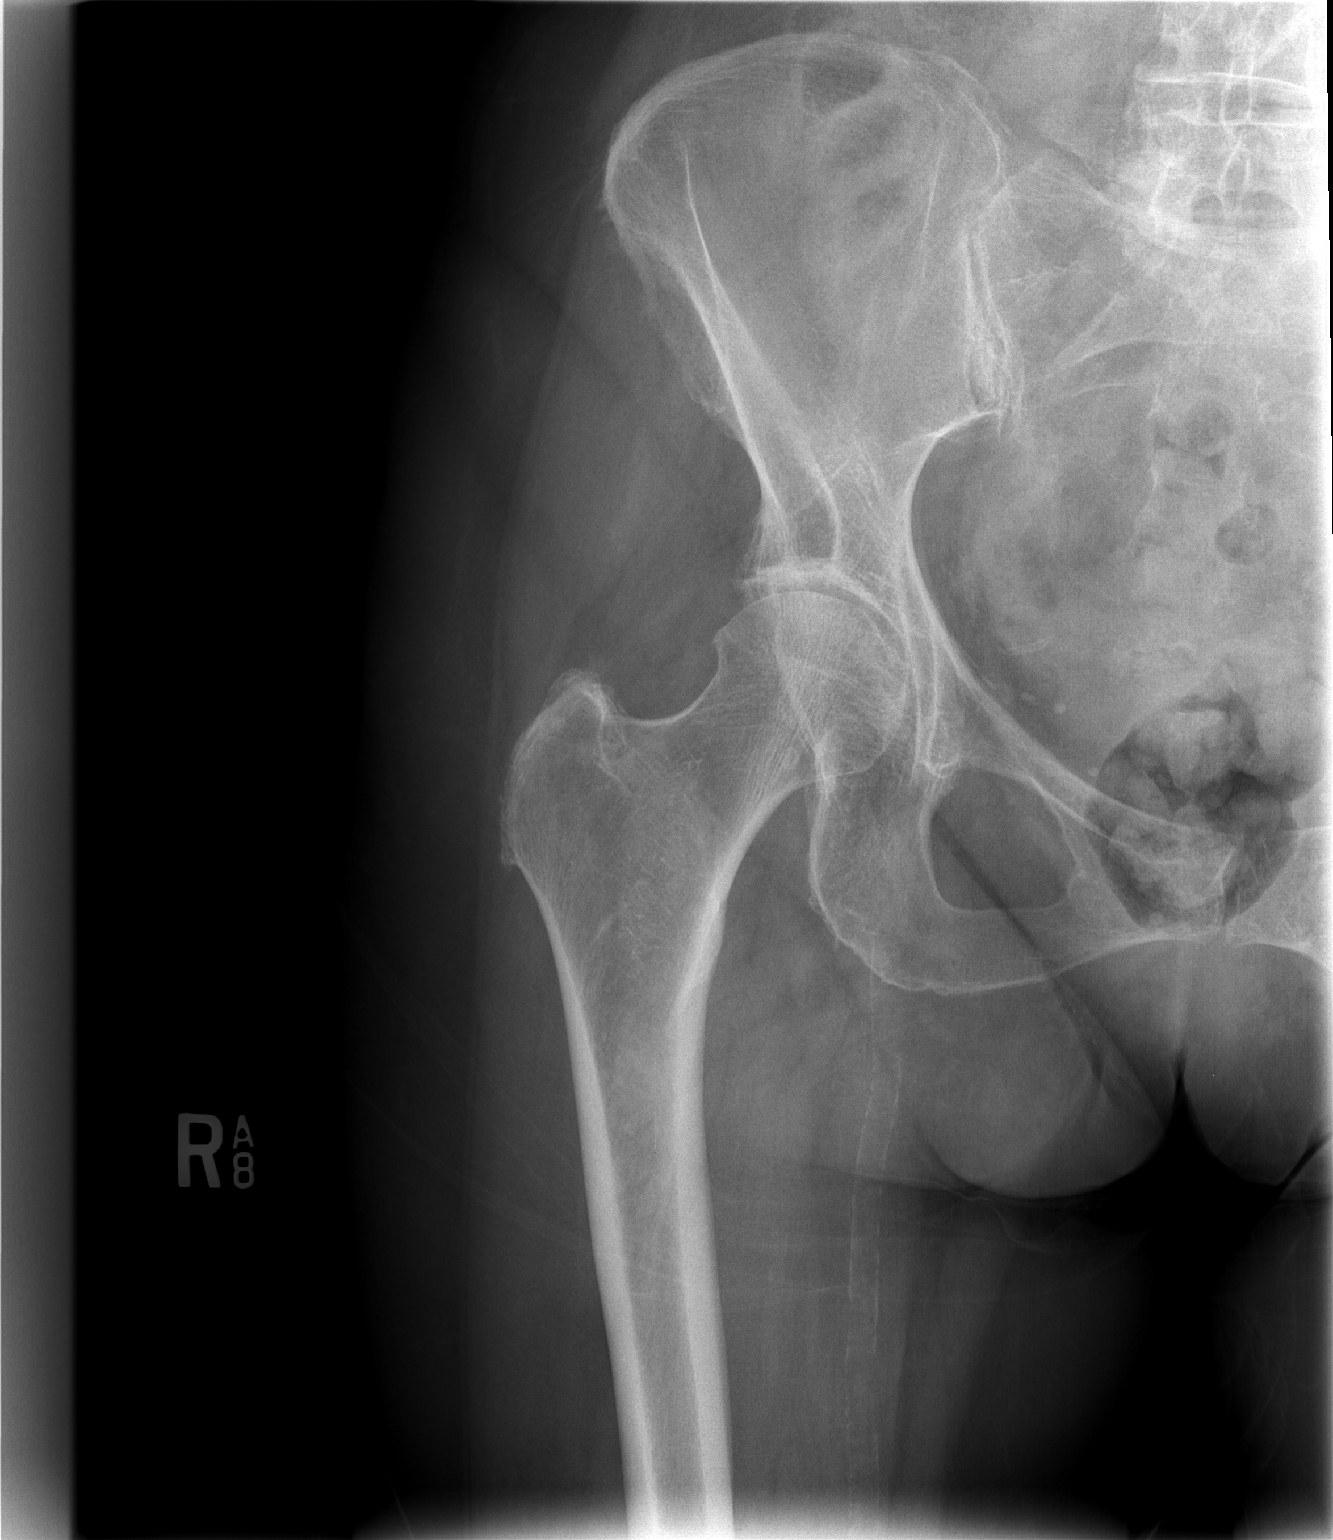

[t hip frog leg right]
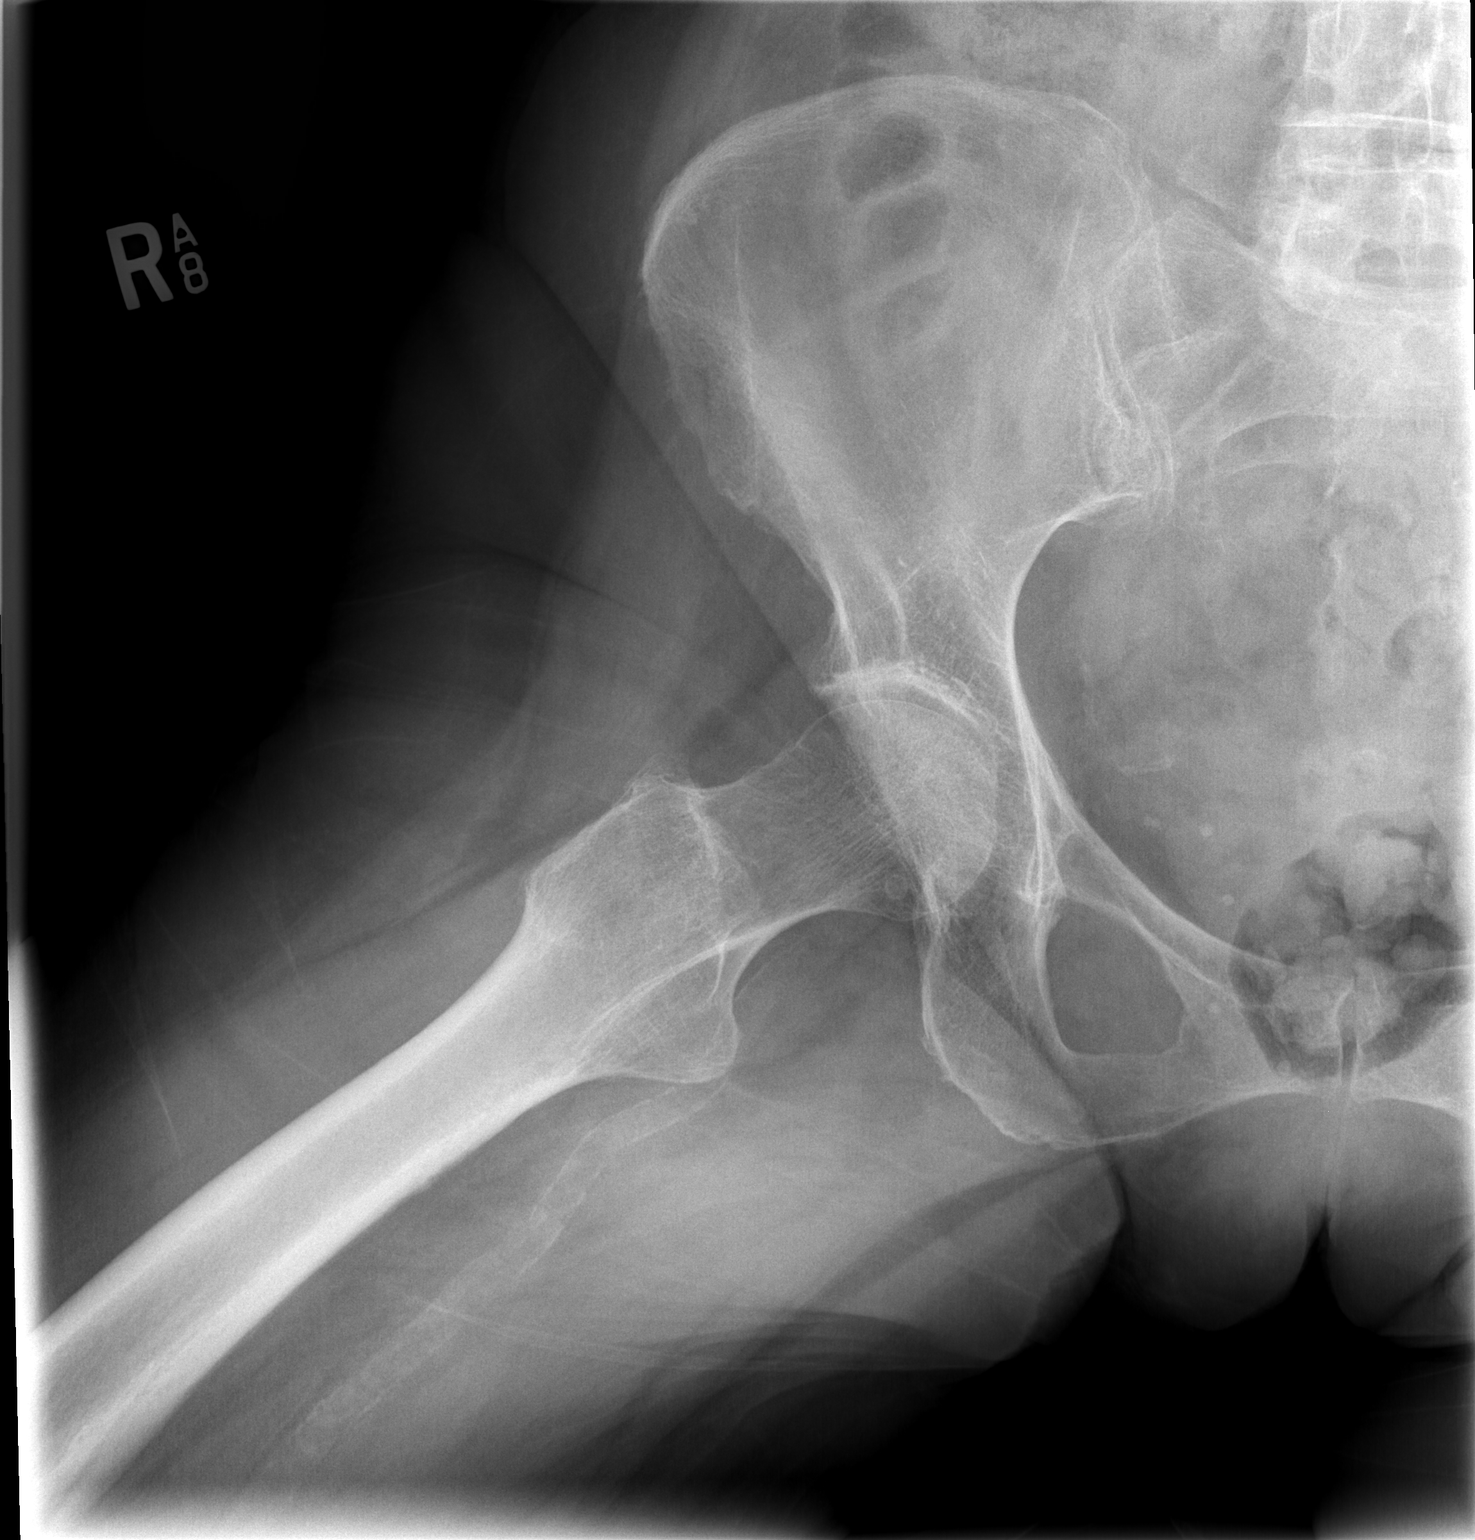

[2 of 2 positions shown; findings below may reference images not displayed]

FINDINGS: No acute fracture or malalignment. Moderate right hip joint space
loss. Diffuse osteopenia. Vascular calcifications.
IMPRESSION: Moderate right hip degenerative changes. No acute osseous
abnormality.

## 2018-05-07 ENCOUNTER — Encounter: Payer: Self-pay | Admitting: Neurology

## 2018-05-07 ENCOUNTER — Telehealth: Payer: Self-pay | Admitting: Neurology

## 2018-05-07 ENCOUNTER — Ambulatory Visit: Payer: Medicare Other | Admitting: Neurology

## 2018-05-07 DIAGNOSIS — R413 Other amnesia: Secondary | ICD-10-CM

## 2018-05-07 MED ORDER — MEMANTINE HCL 10 MG PO TABS: 10.0000 mg | ORAL_TABLET | Freq: Two times a day (BID) | ORAL | 5 refills | Status: AC

## 2018-05-07 NOTE — Telephone Encounter (Signed)
[employer] order sent to GI. They will reach out to the pt to schedule.  °

## 2018-05-07 NOTE — Progress Notes (Signed)
Reason for visit: Dementia  Referring physician: Dr. Alphonsa Gin Williams is a 84 y.o. female  History of present illness:  Penny Williams is an 84 year old right-handed white female with a history of progressive dementing illness over the last 3 to 4 years.  The patient currently is living with 1 of her daughters.  The patient is having increasing problems with word finding, she has difficulty remembering the names of family members but she will recognize them.  She gets frustrated that she loses her train of thought with talking.  She needs help with bathing and dressing, managing medications and financial affairs and keeping up with appointments.  The patient does not cook, she does not operate a motor vehicle.  She usually sleeps fairly well at night.  She does have some times where she is agitated, usually in the morning.  She is on Namenda taking the 14 mg brand-name medication, she has not had any severe problems with balance, she does report some low back pain at times.  She has had an occasional visual hallucination and sometimes she will hear music.  The patient does not have any definite family history of dementia.  She comes to the office today for an evaluation.  Past Medical History:  Diagnosis Date  . Dementia    early stages  . HOH (hard of hearing)    wears hearing aids  . Hypothyroidism   . Stroke (HCC)    TIA's x2  . Uterine polyp     Past Surgical History:  Procedure Laterality Date  . EYE SURGERY Bilateral    cataract extraction  . TONSILLECTOMY      Family History  Problem Relation Age of Onset  . Stroke Mother   . Cirrhosis Father     Social history:  reports that she has never smoked. Her smokeless tobacco use includes snuff. She reports that she does not drink alcohol. Her drug history is not on file.  Medications:  Prior to Admission medications   Medication Sig Start Date End Date Taking? Authorizing Provider  aspirin EC 81 MG tablet Take 81  mg by mouth daily.   Yes [provider]  calcium citrate-vitamin D (CITRACAL+D) 315-200 MG-UNIT tablet Take 1 tablet by mouth daily.   Yes [provider]  chlorhexidine (PERIDEX) 0.12 % solution Use as directed 15 mLs in the mouth or throat 2 (two) times daily.   Yes [provider]  cholecalciferol (VITAMIN D) 1000 units tablet Take 1,000 Units by mouth daily.   Yes [provider]  fluticasone (FLONASE) 50 MCG/ACT nasal spray Place 2 sprays into both nostrils daily. 01/18/17  Yes [provider]  gabapentin (NEURONTIN) 100 MG capsule  04/10/18  Yes [provider]  gabapentin (NEURONTIN) 300 MG capsule  03/12/18  Yes [provider]  levothyroxine (SYNTHROID, LEVOTHROID) 125 MCG tablet Take 125 mcg by mouth daily before breakfast.   Yes [provider]  polyethylene glycol (MIRALAX / GLYCOLAX) packet Take 17 g by mouth daily as needed.   Yes [provider]  Prenatal Vit-Fe Fumarate-FA (M-VIT) tablet Take 1 tablet by mouth daily.   Yes [provider]  memantine (NAMENDA) 10 MG tablet Take 1 tablet (10 mg total) by mouth 2 (two) times daily. 05/07/18   York Spaniel, MD  metoprolol succinate (TOPROL-XL) 50 MG 24 hr tablet Take 1 tablet (50 mg total) by mouth daily. Take with or immediately following a meal. 03/31/17 04/29/18  Delton See,  Faustino CongressKatarina H, MD     No Known Allergies  ROS:  Out of a complete 14 system review of symptoms, the patient complains only of the following symptoms, and all other reviewed systems are negative.  Fatigue Swelling in the legs Hearing loss Moles Cough Constipation Feeling cold Achy muscles Runny nose Memory loss, confusion Anxiety, none of sleep, disinterest in activities, hallucinations Sleepiness  Blood pressure (!) 147/71, pulse 74, weight 134 lb 8 oz (61 kg).  Physical Exam  General: The patient is alert and cooperative at the time of the examination.  Eyes:  Pupils are equal, round, and reactive to light. Discs are flat bilaterally.  Neck: The neck is supple, no carotid bruits are noted.  Respiratory: The respiratory examination is clear.  Cardiovascular: The cardiovascular examination reveals a regular rate and rhythm, no obvious murmurs or rubs are noted.  Skin: Extremities are without significant edema.  Neurologic Exam  Mental status: The patient is alert and oriented x 1 at the time of the examination (not oriented to place or date). The Mini-Mental status examination done today shows a total score of 5/30.  Cranial nerves: Facial symmetry is present. There is good sensation of the face to pinprick and soft touch bilaterally. The strength of the facial muscles and the muscles to head turning and shoulder shrug are normal bilaterally. Speech is well enunciated, no aphasia or dysarthria is noted. Extraocular movements are full. Visual fields are full. The tongue is midline, and the patient has symmetric elevation of the soft palate. No obvious hearing deficits are noted.  Motor: The motor testing reveals 5 over 5 strength of all 4 extremities. Good symmetric motor tone is noted throughout.  Sensory: Sensory testing is intact to pinprick, soft touch and vibration sensation on all 4 extremities. No evidence of extinction is noted.  Coordination: Cerebellar testing reveals that the patient has no definite ataxia but she has apraxia of all 4 extremities.  Gait and station: Gait is normal. Romberg is negative. No drift is seen.  Reflexes: Deep tendon reflexes are symmetric and normal bilaterally. Toes are downgoing bilaterally.   Assessment/Plan:  1.  Progressive dementia, Alzheimer's disease  The patient is having significant issues with dementia.  She is on low-dose Namenda, we will maximize the Namenda dose going to 10 mg twice daily.  The patient will possibly go on Aricept in the future.  The patient will have blood work done today, she  will have a CT scan of the brain.  She will follow-up in 6 months.   Marlan Palau. Keith Gaberiel Youngblood MD 05/07/2018 3:45 PM  [facility] [address] [place], [place] [id number]  Phone [phone] Fax [phone]

## 2018-05-07 NOTE — Patient Instructions (Signed)
  We will increase the Namenda to 10 mg twice a day.  We will get a CT of the brain.

## 2018-05-08 ENCOUNTER — Telehealth: Payer: Self-pay | Admitting: Neurology

## 2018-05-08 LAB — RPR: RPR: NONREACTIVE

## 2018-05-08 LAB — VITAMIN B12: VITAMIN B 12: 1113 pg/mL (ref 232–1245)

## 2018-05-08 LAB — SEDIMENTATION RATE: SED RATE: 51 mm/h — AB (ref 0–40)

## 2018-05-08 NOTE — Telephone Encounter (Signed)
I called the daughter, left a message.  The blood work shows a mild elevation of the sedimentation rate, otherwise unremarkable.  Will follow over time.

## 2018-05-21 ENCOUNTER — Telehealth: Payer: Self-pay | Admitting: Neurology

## 2018-05-21 NOTE — Telephone Encounter (Signed)
Patient's granddaughter POA called to advise patient saw her PCP today and was advised not to have CT scan. So please cancel order.

## 2018-05-22 NOTE — Telephone Encounter (Signed)
FYI only.

## 2018-05-22 NOTE — Telephone Encounter (Signed)
Noted  

## 2018-06-03 NOTE — Telephone Encounter (Signed)
Events noted.  I have discontinued the order for the CT.

## 2018-06-04 ENCOUNTER — Ambulatory Visit: Payer: Medicare Other | Admitting: Sports Medicine

## 2018-06-12 ENCOUNTER — Ambulatory Visit: Payer: Medicare Other | Admitting: Sports Medicine

## 2018-06-12 ENCOUNTER — Encounter: Payer: Self-pay | Admitting: Sports Medicine

## 2018-06-12 DIAGNOSIS — M79675 Pain in left toe(s): Secondary | ICD-10-CM

## 2018-06-12 DIAGNOSIS — I739 Peripheral vascular disease, unspecified: Secondary | ICD-10-CM | POA: Diagnosis not present

## 2018-06-12 DIAGNOSIS — Z872 Personal history of diseases of the skin and subcutaneous tissue: Secondary | ICD-10-CM | POA: Diagnosis not present

## 2018-06-12 DIAGNOSIS — F0150 Vascular dementia without behavioral disturbance: Secondary | ICD-10-CM | POA: Diagnosis not present

## 2018-06-12 DIAGNOSIS — B351 Tinea unguium: Secondary | ICD-10-CM | POA: Diagnosis not present

## 2018-06-12 DIAGNOSIS — M79674 Pain in right toe(s): Secondary | ICD-10-CM

## 2018-06-12 NOTE — Progress Notes (Signed)
Subjective: Penny ForesterHelen M Williams is a 84 y.o. female patient seen today in office with complaint of mildly painful thickened and elongated toenails; unable to trim. Patient is assisted by daughter and grand-daughter who reports that she is in memory care for [place] and has pain around toenails especially at Right great toe.   Review of Systems  Unable to perform ROS: Dementia     Patient Active Problem List   Diagnosis Date Noted  . Tachycardia 02/02/2017  . Stroke (HCC)   . Hypothyroidism     Current Outpatient Medications on File Prior to Visit  Medication Sig Dispense Refill  . ALPRAZolam (XANAX) 0.25 MG tablet     . aspirin EC 81 MG tablet Take 81 mg by mouth daily.    . calcium citrate-vitamin D (CITRACAL+D) 315-200 MG-UNIT tablet Take 1 tablet by mouth daily.    . chlorhexidine (PERIDEX) 0.12 % solution Use as directed 15 mLs in the mouth or throat 2 (two) times daily.    . cholecalciferol (VITAMIN D) 1000 units tablet Take 1,000 Units by mouth daily.    . fluticasone (FLONASE) 50 MCG/ACT nasal spray Place 2 sprays into both nostrils daily.    Marland Kitchen. gabapentin (NEURONTIN) 100 MG capsule     . gabapentin (NEURONTIN) 300 MG capsule     . levothyroxine (SYNTHROID, LEVOTHROID) 125 MCG tablet Take 125 mcg by mouth daily before breakfast.    . memantine (NAMENDA) 10 MG tablet Take 1 tablet (10 mg total) by mouth 2 (two) times daily. 60 tablet 5  . polyethylene glycol (MIRALAX / GLYCOLAX) packet Take 17 g by mouth daily as needed.    . Prenatal Vit-Fe Fumarate-FA (M-VIT) tablet Take 1 tablet by mouth daily.    . metoprolol succinate (TOPROL-XL) 50 MG 24 hr tablet Take 1 tablet (50 mg total) by mouth daily. Take with or immediately following a meal. 90 tablet 3   No current facility-administered medications on file prior to visit.     No Known Allergies  Objective: Physical Exam  General: Well developed, nourished, no acute distress, awake, alert and oriented x 2  Vascular:  Dorsalis pedis artery 1/4 bilateral, Posterior tibial artery 1/4 bilateral, skin temperature warm to cool proximal to distal bilateral lower extremities, + varicosities, no pedal hair present bilateral.  Neurological: Gross sensation present via light touch bilateral.   Dermatological: Skin is warm, dry, and supple bilateral, Nails 1-10 are tender, long, thick, and discolored with mild subungal debris, no webspace macerations present bilateral, no open lesions present bilateral, no acute ingrowing, thick hallux nails, no callus/corns/hyperkeratotic tissue present bilateral. No signs of infection bilateral.  Musculoskeletal: Asymptomatic hammertoe boney deformities noted bilateral. Muscular strength within normal limits without painon range of motion. No pain with calf compression bilateral.  Assessment and Plan:  Problem List Items Addressed This Visit    None    Visit Diagnoses    Pain due to onychomycosis of toenails of both feet    -  Primary   H/O ingrown nail       Vascular dementia without behavioral disturbance       Relevant Medications   ALPRAZolam (XANAX) 0.25 MG tablet   PVD (peripheral vascular disease) (HCC)          -Examined patient.  -Discussed treatment options for painful mycotic nails. -Mechanically debrided and reduced mycotic nails with sterile nail nipper and dremel nail file without incident. -Patient to return in 3 months for follow up evaluation or sooner if symptoms worsen.  Landis Martins, DPM

## 2018-06-14 ENCOUNTER — Other Ambulatory Visit: Payer: Self-pay | Admitting: Cardiology

## 2018-07-15 DEATH — deceased

## 2018-10-31 ENCOUNTER — Telehealth: Payer: Self-pay | Admitting: Neurology

## 2018-10-31 NOTE — Telephone Encounter (Signed)
Pts daughter Bonita QuinLinda called informing the office the pt had passed as of August 2019.

## 2018-10-31 NOTE — Telephone Encounter (Signed)
Events noted

## 2018-11-27 ENCOUNTER — Ambulatory Visit: Payer: Medicare Other | Admitting: Nurse Practitioner
